# Patient Record
Sex: Female | Born: 1975 | Race: Black or African American | Hispanic: No | Marital: Single | Smoking: Former smoker
Health system: Southern US, Community
[De-identification: ages and names within clinical notes are randomized; demographics above are authoritative.]

## PROBLEM LIST (undated history)

## (undated) DIAGNOSIS — F329 Major depressive disorder, single episode, unspecified: Secondary | ICD-10-CM

## (undated) DIAGNOSIS — E890 Postprocedural hypothyroidism: Secondary | ICD-10-CM

## (undated) DIAGNOSIS — G4700 Insomnia, unspecified: Secondary | ICD-10-CM

## (undated) DIAGNOSIS — F32A Depression, unspecified: Secondary | ICD-10-CM

## (undated) DIAGNOSIS — F419 Anxiety disorder, unspecified: Secondary | ICD-10-CM

## (undated) DIAGNOSIS — L509 Urticaria, unspecified: Secondary | ICD-10-CM

## (undated) HISTORY — DX: Anxiety disorder, unspecified: F41.9

## (undated) HISTORY — DX: Depression, unspecified: F32.A

## (undated) HISTORY — DX: Major depressive disorder, single episode, unspecified: F32.9

## (undated) HISTORY — DX: Urticaria, unspecified: L50.9

## (undated) HISTORY — DX: Insomnia, unspecified: G47.00

## (undated) HISTORY — DX: Postprocedural hypothyroidism: E89.0

---

## 2000-12-25 ENCOUNTER — Emergency Department (HOSPITAL_COMMUNITY): Admission: EM | Admit: 2000-12-25 | Discharge: 2000-12-25 | Payer: Self-pay | Admitting: Emergency Medicine

## 2002-10-19 ENCOUNTER — Inpatient Hospital Stay (HOSPITAL_COMMUNITY): Admission: AD | Admit: 2002-10-19 | Discharge: 2002-10-19 | Payer: Self-pay | Admitting: Obstetrics and Gynecology

## 2002-11-01 ENCOUNTER — Ambulatory Visit (HOSPITAL_COMMUNITY): Admission: RE | Admit: 2002-11-01 | Discharge: 2002-11-01 | Payer: Self-pay | Admitting: *Deleted

## 2002-12-10 ENCOUNTER — Inpatient Hospital Stay (HOSPITAL_COMMUNITY): Admission: AD | Admit: 2002-12-10 | Discharge: 2002-12-10 | Payer: Self-pay | Admitting: Obstetrics

## 2003-01-24 ENCOUNTER — Inpatient Hospital Stay (HOSPITAL_COMMUNITY): Admission: AD | Admit: 2003-01-24 | Discharge: 2003-01-24 | Payer: Self-pay | Admitting: Obstetrics

## 2003-02-07 ENCOUNTER — Inpatient Hospital Stay (HOSPITAL_COMMUNITY): Admission: AD | Admit: 2003-02-07 | Discharge: 2003-02-07 | Payer: Self-pay | Admitting: Obstetrics

## 2003-04-06 ENCOUNTER — Encounter: Payer: Self-pay | Admitting: Obstetrics

## 2003-04-06 ENCOUNTER — Inpatient Hospital Stay (HOSPITAL_COMMUNITY): Admission: AD | Admit: 2003-04-06 | Discharge: 2003-04-08 | Payer: Self-pay | Admitting: *Deleted

## 2005-01-07 ENCOUNTER — Inpatient Hospital Stay (HOSPITAL_COMMUNITY): Admission: AD | Admit: 2005-01-07 | Discharge: 2005-01-07 | Payer: Self-pay | Admitting: Obstetrics

## 2005-02-06 ENCOUNTER — Inpatient Hospital Stay (HOSPITAL_COMMUNITY): Admission: AD | Admit: 2005-02-06 | Discharge: 2005-02-06 | Payer: Self-pay | Admitting: Obstetrics

## 2005-02-12 ENCOUNTER — Inpatient Hospital Stay (HOSPITAL_COMMUNITY): Admission: AD | Admit: 2005-02-12 | Discharge: 2005-02-12 | Payer: Self-pay | Admitting: Obstetrics

## 2005-04-15 ENCOUNTER — Inpatient Hospital Stay (HOSPITAL_COMMUNITY): Admission: AD | Admit: 2005-04-15 | Discharge: 2005-04-17 | Payer: Self-pay | Admitting: Obstetrics

## 2005-04-16 ENCOUNTER — Encounter (INDEPENDENT_AMBULATORY_CARE_PROVIDER_SITE_OTHER): Payer: Self-pay | Admitting: Specialist

## 2005-12-17 ENCOUNTER — Encounter (HOSPITAL_COMMUNITY): Admission: RE | Admit: 2005-12-17 | Discharge: 2006-03-17 | Payer: Self-pay | Admitting: Internal Medicine

## 2007-12-10 ENCOUNTER — Encounter: Payer: Self-pay | Admitting: Endocrinology

## 2008-01-18 ENCOUNTER — Emergency Department (HOSPITAL_COMMUNITY): Admission: EM | Admit: 2008-01-18 | Discharge: 2008-01-18 | Payer: Self-pay | Admitting: Emergency Medicine

## 2008-02-21 ENCOUNTER — Ambulatory Visit: Payer: Self-pay | Admitting: Endocrinology

## 2008-02-21 DIAGNOSIS — L509 Urticaria, unspecified: Secondary | ICD-10-CM

## 2008-02-21 HISTORY — DX: Urticaria, unspecified: L50.9

## 2008-03-22 ENCOUNTER — Encounter: Admission: RE | Admit: 2008-03-22 | Discharge: 2008-03-22 | Payer: Self-pay | Admitting: Endocrinology

## 2008-04-11 ENCOUNTER — Telehealth (INDEPENDENT_AMBULATORY_CARE_PROVIDER_SITE_OTHER): Payer: Self-pay | Admitting: *Deleted

## 2008-04-12 ENCOUNTER — Encounter: Payer: Self-pay | Admitting: Endocrinology

## 2008-04-20 ENCOUNTER — Encounter: Admission: RE | Admit: 2008-04-20 | Discharge: 2008-04-20 | Payer: Self-pay | Admitting: Endocrinology

## 2008-06-14 ENCOUNTER — Telehealth (INDEPENDENT_AMBULATORY_CARE_PROVIDER_SITE_OTHER): Payer: Self-pay | Admitting: *Deleted

## 2008-11-28 ENCOUNTER — Telehealth (INDEPENDENT_AMBULATORY_CARE_PROVIDER_SITE_OTHER): Payer: Self-pay | Admitting: *Deleted

## 2009-06-01 IMAGING — CR DG ANKLE COMPLETE 3+V*R*
3 series · 3 of 3 positions shown · non-contrast
Comparison: None

CLINICAL DATA: Pain post fall and twisted ankle

RIGHT ANKLE - COMPLETE 3+ VIEW

[t ankle joint ap right]
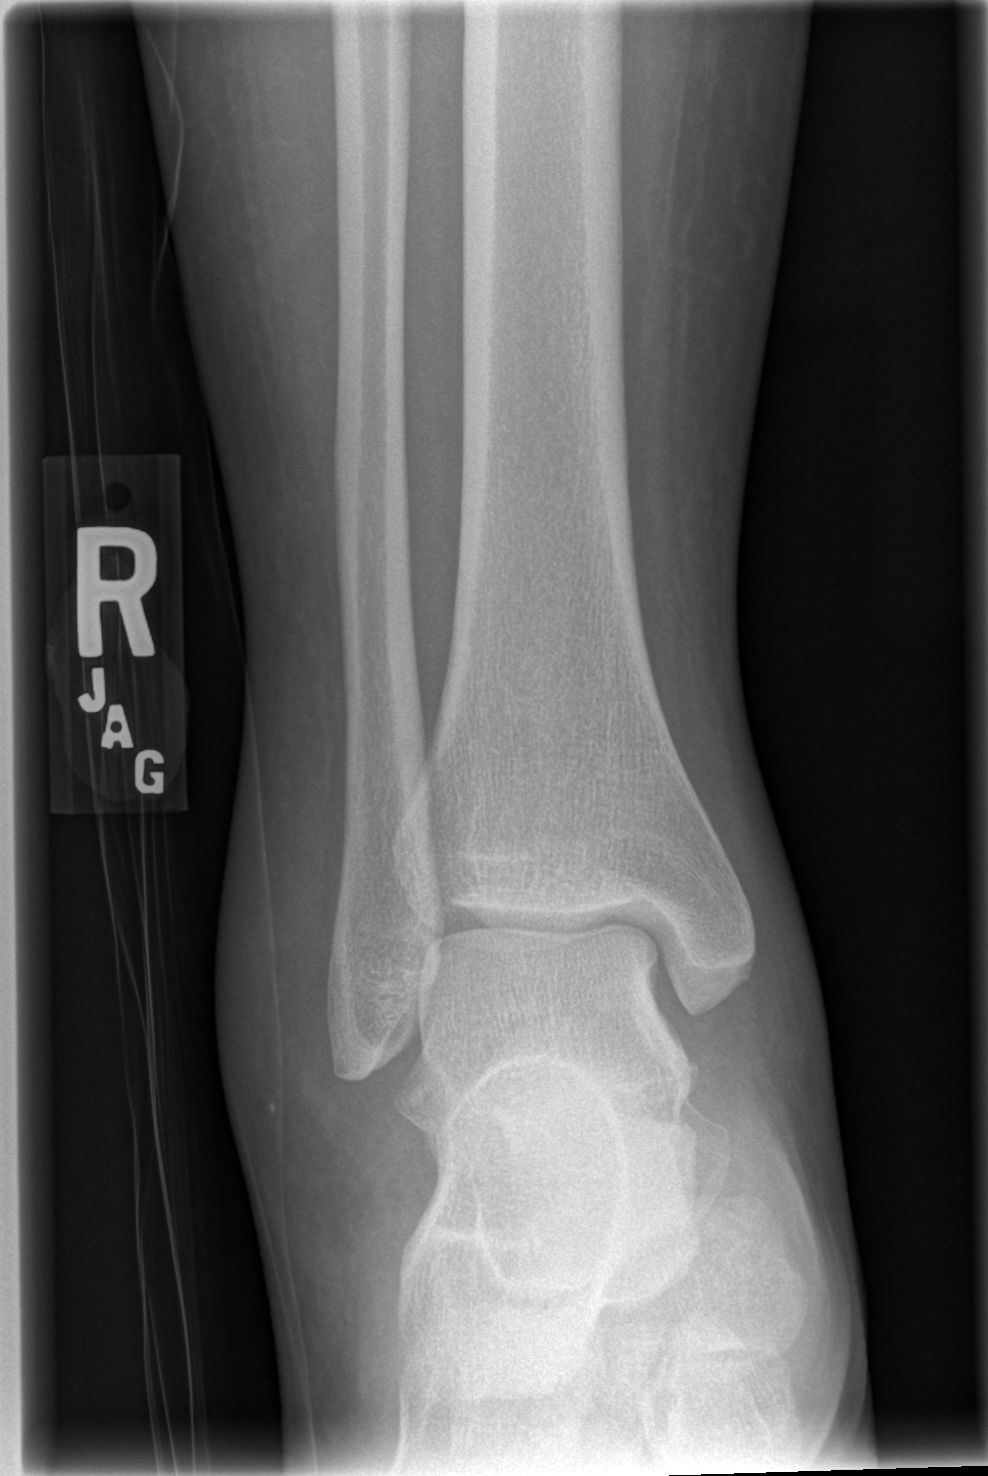

[t ankle joint oblique right]
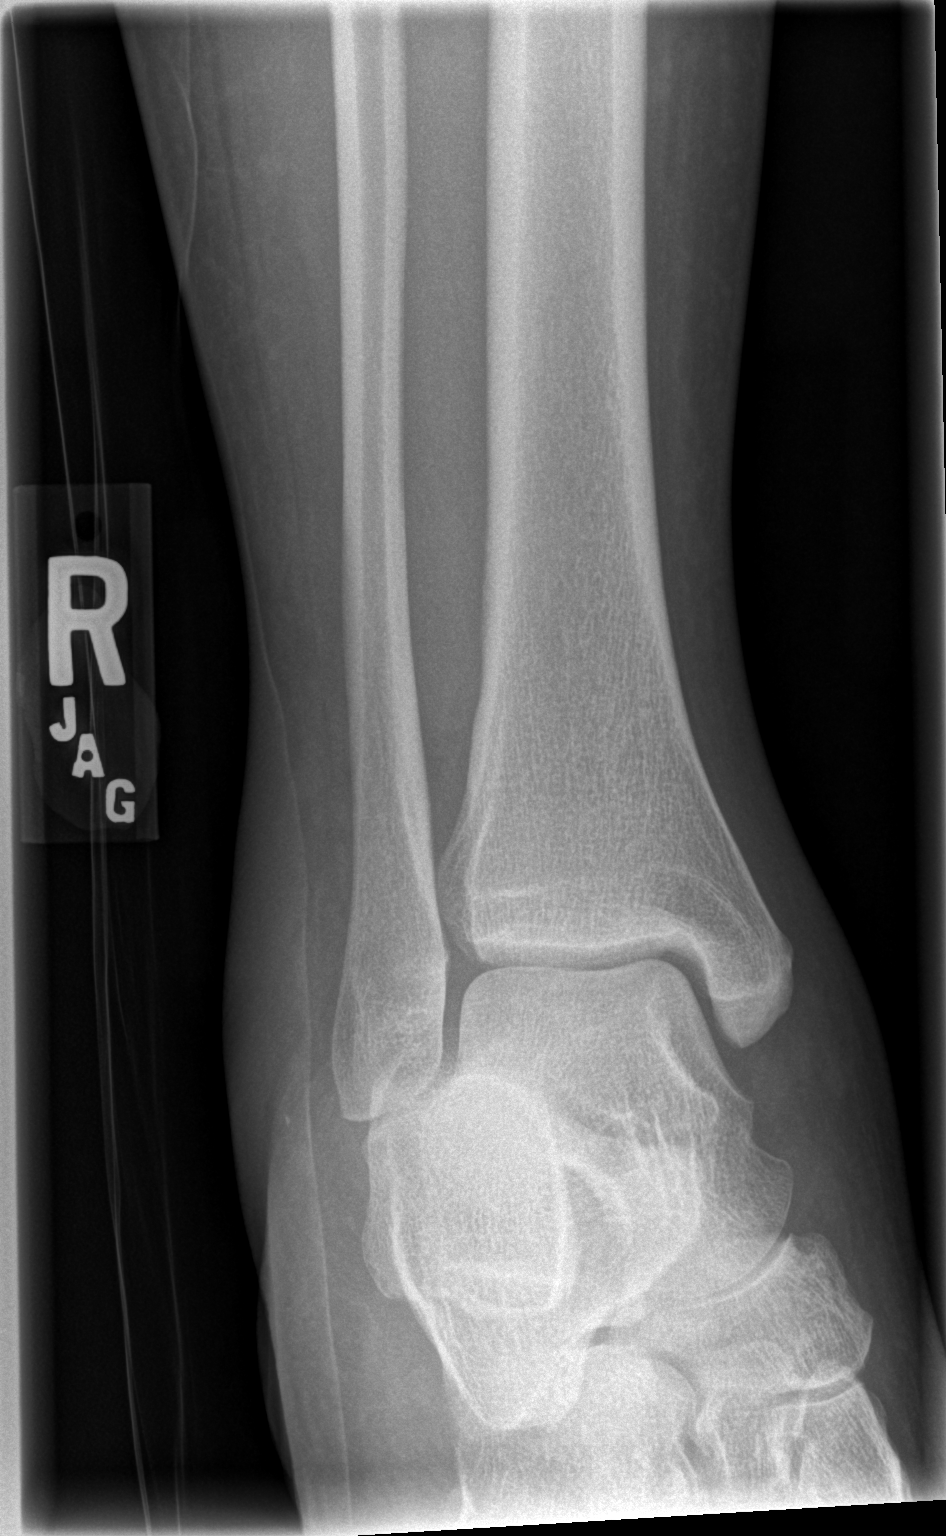

[t ankle joint lat right]
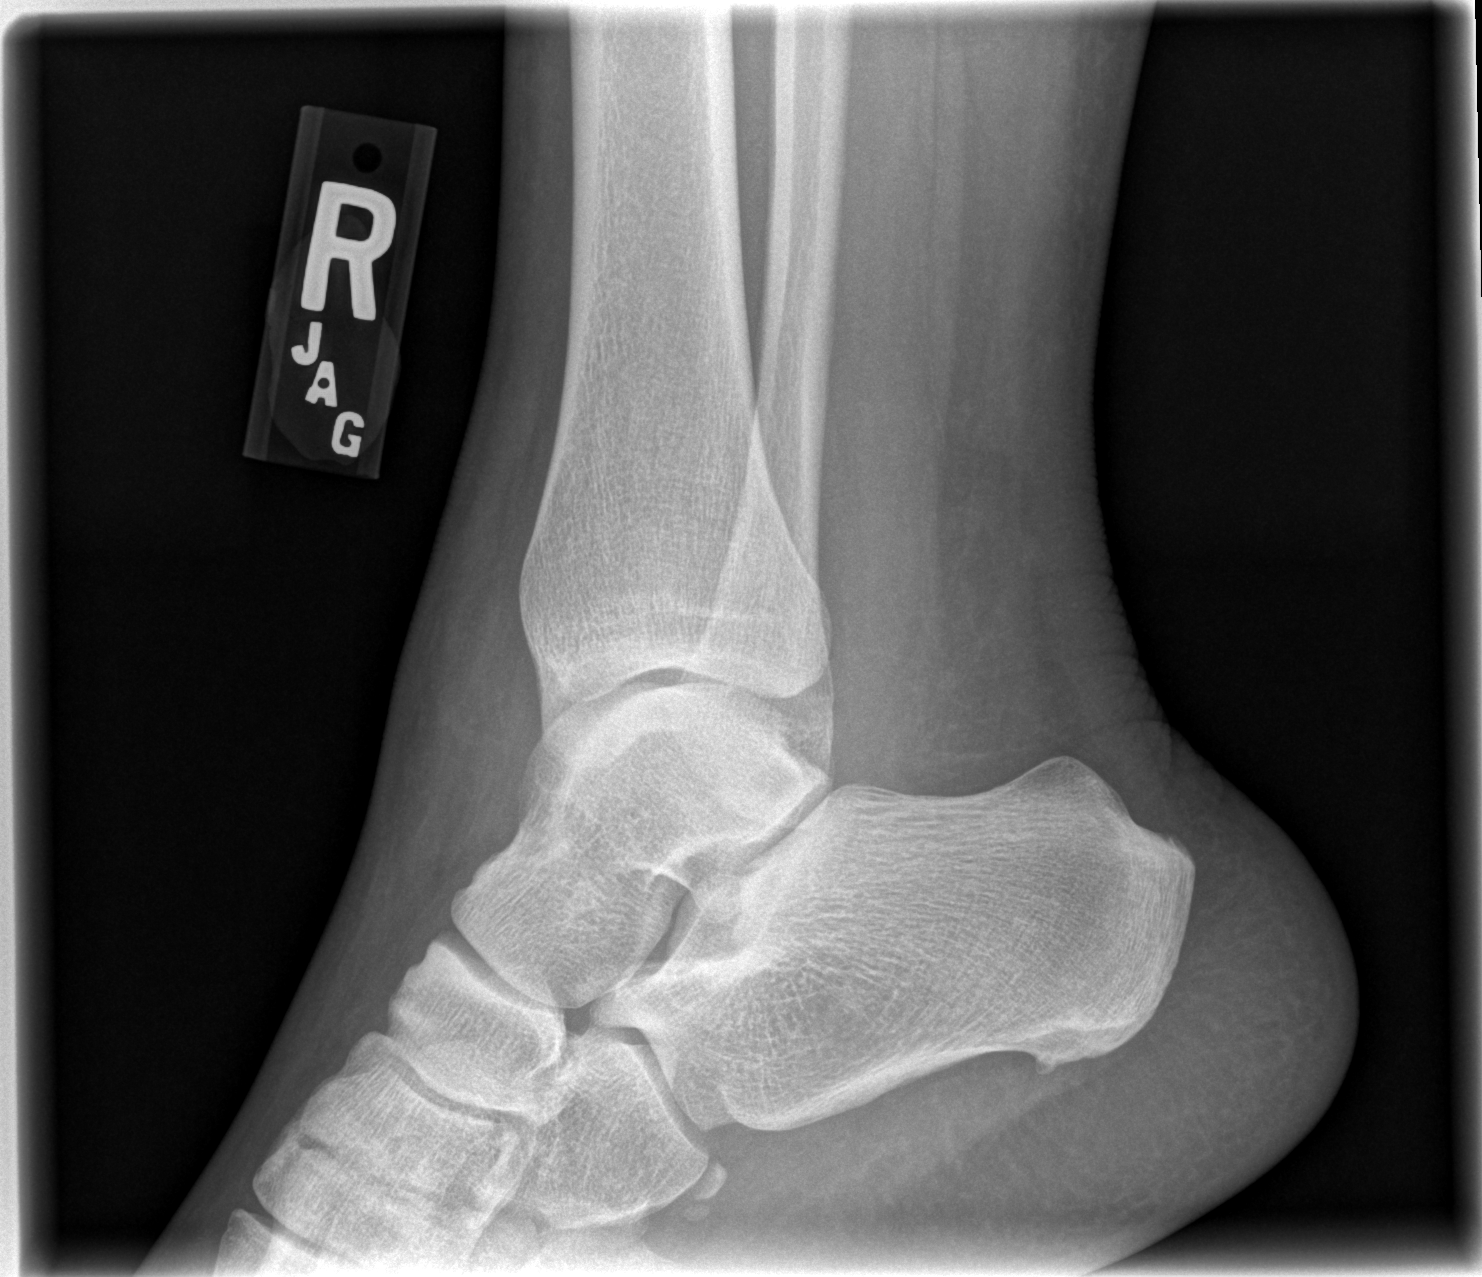

[3 of 3 positions shown; findings below may reference images not displayed]

FINDINGS: Three views of the right ankle shows no acute fracture or
dislocation.  Soft tissue swelling noted laterally adjacent to
lateral malleolus.  A small plantar spur of the calcaneus noted.
IMPRESSION: No acute fracture or subluxation.  Soft tissue swelling noted
laterally.  Small plantar spur of the calcaneus.

## 2009-12-11 ENCOUNTER — Ambulatory Visit: Payer: Self-pay | Admitting: Endocrinology

## 2009-12-11 DIAGNOSIS — E890 Postprocedural hypothyroidism: Secondary | ICD-10-CM

## 2009-12-11 HISTORY — DX: Postprocedural hypothyroidism: E89.0

## 2009-12-11 LAB — CONVERTED CEMR LAB: TSH: 22.44 microintl units/mL — ABNORMAL HIGH (ref 0.35–5.50)

## 2010-02-05 ENCOUNTER — Ambulatory Visit: Payer: Self-pay | Admitting: Endocrinology

## 2010-10-08 NOTE — Assessment & Plan Note (Signed)
Summary: f/u w/sae per pt/#/cd   Vital Signs:  Patient profile:   35 year old female Height:      65 inches (165.10 cm) Weight:      209.75 pounds (95.34 kg) BMI:     35.03 O2 Sat:      98 % on Room air Temp:     97.5 degrees F (36.39 degrees C) oral Pulse rate:   70 / minute BP sitting:   124 / 70  (left arm) Cuff size:   regular  Vitals Entered By: Josph Macho RMA (December 11, 2009 10:45 AM)  O2 Flow:  Room air CC: Follow-up visit/ pt states she is no longer taking Alprazolam, Prozac, or Hydroxyzine/ CF Is Patient Diabetic? No   Primary Provider:  Simone Curia  CC:  Follow-up visit/ pt states she is no longer taking Alprazolam, Prozac, and or Hydroxyzine/ CF.  History of Present Illness: pt had i-131 rx for hyperthyroidism in 2009.  she has fatigue and weight gain.  she is overdue for f/u.  Current Medications (verified): 1)  Alprazolam 1 Mg  Tabs (Alprazolam) .... Take 1/2-1 By Mouth Prn 2)  Prozac 20 Mg  Caps (Fluoxetine Hcl) .... Take 1 By Mouth Once Daily Prn 3)  Hydroxyzine Hcl 25 Mg  Tabs (Hydroxyzine Hcl) .... Take 1 At Bedtime Prn 4)  Xanax .... 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  Review of Systems       she has dry skin  Physical Exam  General:  normal appearance.   Head:  eyes: no periorbital swelling, no proptosis  Neck:  Supple without thyroid enlargement or tenderness.  Neurologic:  voice is slightly hoarse Additional Exam:  FastTSH              [H]  22.44 uIU/mL    Impression & Recommendations:  Problem # 1:  HYPOTHYROIDISM, POST-RADIATION (ICD-244.1) rx needed  Medications Added to Medication List This Visit: 1)  Levothyroxine Sodium 75 Mcg Tabs (Levothyroxine sodium) .Marland Kitchen.. 1 once  daily  Other Orders: TLB-TSH (Thyroid Stimulating Hormone) (84443-TSH) Est. Patient Level III ([address])  Patient Instructions: 1)  blood test today.  it will probably say that you need to start taking thyroid hormone, which you will need indefinitely.   2)  tests are being ordered for you today.  a few days after the test(s), please call [phone] to hear your test results. 3)  (update: i left message on phone-tree:  take synthroid 75 micrograms/day.  go to lab in 1 month for tsh 244.1). Prescriptions: LEVOTHYROXINE SODIUM 75 MCG TABS (LEVOTHYROXINE SODIUM) 1 once daily  #30 x 1   Entered and Authorized by:   Minus Breeding MD   Signed by:   Minus Breeding MD on 12/11/2009   Method used:   Electronically to        [facility] [phone]* (retail)       [address]       [place], [place]  [address]       Ph: [phone] or [phone]       Fax: [phone]   RxID:   [id number]

## 2011-01-24 NOTE — Op Note (Signed)
NAME:  Rachel Myers, Rachel Myers            ACCOUNT NO.:  [id number]   MEDICAL RECORD NO.:  [id number]          PATIENT TYPE:  INP   LOCATION:  9135                          FACILITY:  [facility]   PHYSICIAN:  Kathreen Cosier, M.D.DATE OF BIRTH:  1976/05/16   DATE OF PROCEDURE:  04/16/2005  DATE OF DISCHARGE:                                 OPERATIVE REPORT   PREOPERATIVE DIAGNOSIS:  Multiparity.   OPERATION/PROCEDURE:  Postpartum tubal ligation.   ANESTHESIA:  Epidural.   DESCRIPTION OF PROCEDURE:  The patient was placed in the supine position,  abdomen was prepped and draped. Bladder emptied with straight catheter.  Midline subumbilical incision one inch long was made, carried down to the  fascia.  Fascia was cleaned and grasped with two Kochers.  The fascia and  peritoneum opened.  The left tube was grasped in the mid portion with the  Babcock clamp. The tube was traced to the fimbria.  A 0 plain suture was  placed on the mesosalpinx below the portion of the tube within the clamp.  This was tied.  Approximately one inch of tube was transected.  Hemostasis  satisfactory.  Procedure was done in exact fashion on the other side.  Lap  and sponge counts correct.  Abdomen closed in layers.  Peritoneum and fascia  with continuous suture with 0 Dexon.  Skin closed with subcuticular stitch  of 4-0 Monocryl.  Blood loss minimal.  The patient tolerated the procedure  well and taken to the recovery room in good condition.       BAM/MEDQ  D:  04/16/2005  T:  04/16/2005  Job:  [id number]

## 2011-01-24 NOTE — Discharge Summary (Signed)
[name], [name]            ACCOUNT NO.:  [id number]   MEDICAL RECORD NO.:  [id number]          PATIENT TYPE:  INP   LOCATION:  9135                          [facility]ACILITY:  [facility]   PHYSICIAN:  Kathreen Cosier, M.D.DATE OF BIRTH:  1976-06-30   DATE OF ADMISSION:  04/15/2005  DATE OF DISCHARGE:  04/17/2005                                 DISCHARGE SUMMARY   The patient is a 35 year old gravida 3, para 1, 1, 1 with EDC April 15, 2005, who wanted to be induced so was brought in on April 15, 2005. Cervix 2  cm, 50%, vertex -3. She progressed satisfactorily and had a normal vaginal  delivery of a female infant with Apgar's 9 and 9. She desired sterilization  and on April 16, 2005 underwent postpartum tubal ligation.   On admission her hemoglobin was 11.7, white count 6800, platelets 189,000.  Post delivery hemoglobin was 10.9, white count 7400. RPR negative.  Urinalysis negative. Group B strep negative.   She was discharged home on the second postpartum day on Tylenol #3 for pain  with no complaints.   DISCHARGE DIAGNOSES:  1.  Status post normal vaginal delivery at term.  2.  Postpartum tubal ligation.       BAM/MEDQ  D:  04/17/2005  T:  04/17/2005  Job:  [id number]

## 2011-04-14 ENCOUNTER — Other Ambulatory Visit: Payer: Self-pay | Admitting: Endocrinology

## 2011-09-19 ENCOUNTER — Other Ambulatory Visit: Payer: Self-pay | Admitting: Endocrinology

## 2011-09-24 ENCOUNTER — Other Ambulatory Visit: Payer: Self-pay | Admitting: Endocrinology

## 2011-11-05 ENCOUNTER — Other Ambulatory Visit: Payer: Self-pay | Admitting: Endocrinology

## 2012-01-02 ENCOUNTER — Other Ambulatory Visit: Payer: Self-pay | Admitting: *Deleted

## 2012-01-02 MED ORDER — LEVOTHYROXINE SODIUM 75 MCG PO TABS: ORAL_TABLET | ORAL | Status: DC

## 2012-01-02 NOTE — Telephone Encounter (Signed)
Pt made appointment for F/U OV with MD in June. Pt is asking for refills of medication until her appointment. Rx sent to pharmacy, pt informed.

## 2012-02-09 ENCOUNTER — Ambulatory Visit: Payer: Self-pay | Admitting: Endocrinology

## 2012-02-13 ENCOUNTER — Ambulatory Visit: Payer: Self-pay | Admitting: Endocrinology

## 2012-04-02 ENCOUNTER — Ambulatory Visit: Payer: Self-pay | Admitting: Endocrinology

## 2012-04-16 ENCOUNTER — Encounter: Payer: Self-pay | Admitting: Endocrinology

## 2012-04-16 ENCOUNTER — Other Ambulatory Visit (INDEPENDENT_AMBULATORY_CARE_PROVIDER_SITE_OTHER): Payer: Managed Care, Other (non HMO)

## 2012-04-16 ENCOUNTER — Ambulatory Visit (INDEPENDENT_AMBULATORY_CARE_PROVIDER_SITE_OTHER): Payer: Managed Care, Other (non HMO) | Admitting: Endocrinology

## 2012-04-16 VITALS — BP 102/70 | HR 67 | Temp 97.9°F | Ht 65.0 in | Wt 193.0 lb

## 2012-04-16 DIAGNOSIS — E890 Postprocedural hypothyroidism: Secondary | ICD-10-CM

## 2012-04-16 LAB — TSH: TSH: 17.24 u[IU]/mL — ABNORMAL HIGH (ref 0.35–5.50)

## 2012-04-16 NOTE — Progress Notes (Signed)
  Subjective:    Patient ID: Rachel Myers, female    DOB: 19-Aug-1976, 36 y.o.   MRN: [id number]  HPI pt had i-131 rx for hyperthyroidism in 2009.  She has been on synthroid since then, but she is uncertain if she takes the synthroid as rx'ed.  pt states she feels well in general. Past Medical History  Diagnosis Date  . HYPOTHYROIDISM, POST-RADIATION 12/11/2009    Qualifier: Diagnosis of  By: Everardo All MD, Gregary Signs A   . UNSPECIFIED URTICARIA 02/21/2008    Qualifier: Diagnosis of  By: Everardo All MD, Irish Piech A     No past surgical history on file.  History   Social History  . Marital Status: Single    Spouse Name: N/A    Number of Children: N/A  . Years of Education: N/A   Occupational History  . Customer Service    Social History Main Topics  . Smoking status: Current Some Day Smoker  . Smokeless tobacco: Not on file  . Alcohol Use:   . Drug Use:   . Sexually Active:    Other Topics Concern  . 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    Current Outpatient Prescriptions on File Prior to Visit  Medication Sig Dispense Refill  . citalopram (CELEXA) 20 MG tablet Take 20 mg by mouth daily.      . clonazePAM (KLONOPIN) 0.5 MG tablet 1/2-1 tablet by mouth twice daily as needed      . levothyroxine (SYNTHROID, LEVOTHROID) 75 MCG tablet take 1 tablet by mouth once daily  30 tablet  2  . pantoprazole (PROTONIX) 40 MG tablet Take 40 mg by mouth daily.        No Known Allergies  Family History  Problem Relation Age of Onset  . Thyroid disease Neg Hx     No thyroid dz in immediate family    BP 102/70  Pulse 67  Temp 97.9 F (36.6 C) (Oral)  Ht 5\' 5"  (1.651 m)  Wt 193 lb (87.544 kg)  BMI 32.12 kg/m2  SpO2 98%  LMP 04/11/2012  Review of Systems She has lost a few lbs, due to her efforts.     Objective:   Physical Exam VITAL SIGNS:  See vs page GENERAL: no distress NECK: There is no palpable thyroid enlargement.  No  thyroid nodule is palpable.  No palpable lymphadenopathy at the anterior neck.     Lab Results  Component Value Date   TSH 17.24* 04/16/2012      Assessment & Plan:  Post-i-131 hypothyroidism.  Therapy possibly limited by noncompliance.  i'll do the best i can.

## 2012-04-16 NOTE — Patient Instructions (Addendum)
blood tests are being requested for you today.  You will receive a letter with results. Please return in 1 year. 

## 2012-04-19 ENCOUNTER — Telehealth: Payer: Self-pay | Admitting: *Deleted

## 2012-04-19 DIAGNOSIS — E890 Postprocedural hypothyroidism: Secondary | ICD-10-CM

## 2012-04-19 NOTE — Telephone Encounter (Signed)
Per Result note, pt states that prior to [facility] lab she had missed taking her Levothryoxine for 1 week because she was unable to pay for it at that particular time.

## 2012-04-19 NOTE — Telephone Encounter (Signed)
please call patient: Please continue same rx Recheck blood tet in 1 month.  i have ordered

## 2012-04-20 NOTE — Telephone Encounter (Signed)
Pt informed of MD's advisement. 

## 2012-04-22 ENCOUNTER — Other Ambulatory Visit: Payer: Self-pay | Admitting: Family Medicine

## 2012-04-22 DIAGNOSIS — R109 Unspecified abdominal pain: Secondary | ICD-10-CM

## 2012-04-30 ENCOUNTER — Other Ambulatory Visit (HOSPITAL_COMMUNITY): Payer: Managed Care, Other (non HMO)

## 2012-05-28 ENCOUNTER — Other Ambulatory Visit (HOSPITAL_COMMUNITY): Payer: Managed Care, Other (non HMO)

## 2012-10-10 ENCOUNTER — Other Ambulatory Visit: Payer: Self-pay | Admitting: Endocrinology

## 2012-10-11 NOTE — Telephone Encounter (Signed)
PHARMACY REQUEST REFILL ON MEDICATION LEVOTHYROXINE. LAST OV WAS 04/16/2012.

## 2012-11-29 ENCOUNTER — Encounter: Payer: Self-pay | Admitting: *Deleted

## 2012-12-28 ENCOUNTER — Other Ambulatory Visit: Payer: Self-pay | Admitting: Endocrinology

## 2013-01-04 ENCOUNTER — Other Ambulatory Visit: Payer: Self-pay | Admitting: Nurse Practitioner

## 2013-01-05 NOTE — Telephone Encounter (Signed)
Rx printed and called into [facility].

## 2013-01-05 NOTE — Telephone Encounter (Signed)
Needs refill on Klonopin 0.5 mg, PE 03/05/12

## 2013-01-05 NOTE — Telephone Encounter (Signed)
Ok times one with one ref 

## 2013-01-07 ENCOUNTER — Encounter: Payer: Self-pay | Admitting: *Deleted

## 2013-05-30 ENCOUNTER — Encounter: Payer: Self-pay | Admitting: Family Medicine

## 2013-06-06 ENCOUNTER — Ambulatory Visit (INDEPENDENT_AMBULATORY_CARE_PROVIDER_SITE_OTHER): Payer: Managed Care, Other (non HMO) | Admitting: Family Medicine

## 2013-06-06 ENCOUNTER — Encounter: Payer: Self-pay | Admitting: Family Medicine

## 2013-06-06 VITALS — BP 114/70 | HR 70 | Ht 65.75 in | Wt 194.0 lb

## 2013-06-06 DIAGNOSIS — Z1159 Encounter for screening for other viral diseases: Secondary | ICD-10-CM

## 2013-06-06 DIAGNOSIS — Z0000 Encounter for general adult medical examination without abnormal findings: Secondary | ICD-10-CM

## 2013-06-06 DIAGNOSIS — Z124 Encounter for screening for malignant neoplasm of cervix: Secondary | ICD-10-CM

## 2013-06-06 MED ORDER — HYDROCORTISONE 2.5 % RE CREA: TOPICAL_CREAM | Freq: Three times a day (TID) | RECTAL | Status: DC

## 2013-06-06 MED ORDER — PANTOPRAZOLE SODIUM 40 MG PO TBEC: 40.0000 mg | DELAYED_RELEASE_TABLET | Freq: Every day | ORAL | Status: DC

## 2013-06-06 NOTE — Progress Notes (Signed)
  Subjective:    Patient ID: Genene Churn, female    DOB: 02-25-1976, 37 y.o.   MRN: [id number]  HPIHere for a physical.   Hemorroid. sstarted acting up in the past yr, bleeding, unconfortable, not much constipation,  No medication for the hemorrhoids, blood mixed in with stools  Use btl,  Sp eight yrs ago  Exercising some off and on,so so on the cardio  Diet very poor, eats once perday, One so per day Reflux. Takes in a lot of cafffeince Chol panel overall good   Difficulty focusing. Never on meds.   Not sleeping.   Works as Therapist, nutritional   Review of Systems No chest pain no back pain no abdominal pain no change in urinary or bowel habits somewhat diminished energy he claims no depression notes some anxiety at times however.    Objective:   Physical Exam Alert HEENT normal. Lungs clear. Heart regular in rhythm. Breasts without masses. Abdomen benign. Perirectal region couple small hemorrhoids noted pelvic rectal exam within normal limits. Pap smear obtained.       Assessment & Plan:  Impression #1 wellness exam #2 hemorrhoids discussed #3 significant reflux. Plan diet exercise discussed. Medications prescribed. Followup as scheduled for further assessment of symptomatology. WSL

## 2013-06-07 LAB — PAP IG W/ RFLX HPV ASCU

## 2013-08-08 ENCOUNTER — Ambulatory Visit: Payer: Managed Care, Other (non HMO) | Admitting: Family Medicine

## 2013-09-12 ENCOUNTER — Ambulatory Visit: Payer: Managed Care, Other (non HMO) | Admitting: Family Medicine

## 2013-09-21 ENCOUNTER — Ambulatory Visit: Payer: Managed Care, Other (non HMO) | Admitting: Family Medicine

## 2013-10-31 ENCOUNTER — Other Ambulatory Visit: Payer: Self-pay | Admitting: Endocrinology

## 2014-01-05 ENCOUNTER — Other Ambulatory Visit: Payer: Self-pay | Admitting: Endocrinology

## 2014-01-19 ENCOUNTER — Other Ambulatory Visit: Payer: Self-pay | Admitting: Endocrinology

## 2014-02-01 ENCOUNTER — Telehealth: Payer: Self-pay | Admitting: *Deleted

## 2014-02-01 NOTE — Telephone Encounter (Signed)
Please refill x 1.  This is last refill until appt

## 2014-02-01 NOTE — Telephone Encounter (Signed)
Request RX refill x2 still waiting to get it, been out of medication for 2 wks

## 2014-02-01 NOTE — Telephone Encounter (Signed)
Pt is requesting a refill on her thyroid medication. Pt was instructed back in February to make an appointment and did not. Pt does not have insurance and is waiting for her medicaid to start. Pt was last seen on 04/2012. Please advise, Thanks!

## 2014-02-02 MED ORDER — LEVOTHYROXINE SODIUM 75 MCG PO TABS: ORAL_TABLET | ORAL | Status: DC

## 2014-02-02 NOTE — Telephone Encounter (Signed)
Pt advised.

## 2014-03-15 ENCOUNTER — Ambulatory Visit: Payer: Managed Care, Other (non HMO) | Admitting: Endocrinology

## 2014-03-23 ENCOUNTER — Encounter: Payer: Self-pay | Admitting: Endocrinology

## 2014-03-23 ENCOUNTER — Ambulatory Visit (INDEPENDENT_AMBULATORY_CARE_PROVIDER_SITE_OTHER): Payer: Medicaid Other | Admitting: Endocrinology

## 2014-03-23 VITALS — BP 118/64 | HR 80 | Temp 98.5°F | Wt 193.0 lb

## 2014-03-23 DIAGNOSIS — E890 Postprocedural hypothyroidism: Secondary | ICD-10-CM

## 2014-03-23 LAB — T4, FREE: FREE T4: 0.54 ng/dL — AB (ref 0.60–1.60)

## 2014-03-23 LAB — TSH: TSH: 6.16 u[IU]/mL — AB (ref 0.35–4.50)

## 2014-03-23 MED ORDER — LEVOTHYROXINE SODIUM 75 MCG PO TABS: ORAL_TABLET | ORAL | Status: DC

## 2014-03-23 NOTE — Progress Notes (Signed)
   Subjective:    Patient ID: Rachel Myers, female    DOB: 12/12/1975, 38 y.o.   MRN: [id number]  HPI pt returns for f/u of post I-131 hypothyroidism (she had I-131 rx for hyperthyroidism, due to Grave's dz in 2009; she has been on synthroid since then; she has had TL).  Pt says she misses the synthroid approx twice a week.  She has fatigue.   Past Medical History  Diagnosis Date  . HYPOTHYROIDISM, POST-RADIATION 12/11/2009    Qualifier: Diagnosis of  By: Everardo AllEllison MD, Gregary SignsSean A   . UNSPECIFIED URTICARIA 02/21/2008    Qualifier: Diagnosis of  By: Everardo AllEllison MD, Cleophas DunkerSean A   . Depression   . Anxiety   . Insomnia     No past surgical history on file.  History   Social History  . Marital Status: Single    Spouse Name: N/A    Number of Children: N/A  . Years of Education: N/A   Occupational History  . Customer Service    Social History Main Topics  . Smoking status: Former Games developermoker  . Smokeless tobacco: Not on file  . Alcohol Use: Not on file  . Drug Use: Not on file  . Sexual Activity: Not on file   Other Topics Concern  . Not on file   Social History Narrative   Single mother.   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          Current Outpatient Prescriptions on File Prior to Visit  Medication Sig Dispense Refill  . clonazePAM (KLONOPIN) 0.5 MG tablet TAKE 1/2-1 TABLET BY MOUTH TWICE DAILY AS NEEDED FOR ANXIETY  30 tablet  0  . hydrocortisone (ANUSOL-HC) 2.5 % rectal cream Place rectally 3 (three) times daily.  30 g  3  . pantoprazole (PROTONIX) 40 MG tablet Take 1 tablet (40 mg total) by mouth daily.  30 tablet  3   No current facility-administered medications on file prior to visit.    No Known Allergies  Family History  Problem Relation Age of Onset  . Thyroid disease Neg Hx     No thyroid dz in immediate family    BP 118/64  Pulse 80  Temp(Src) 98.5 F (36.9 C) (Oral)  Wt 193 lb (87.544 kg)  SpO2 96%  Review of Systems Denies weight change.      Objective:     Physical Exam VITAL SIGNS:  See vs page GENERAL: no distress NECK: There is no palpable thyroid enlargement.  No thyroid nodule is palpable.  No palpable lymphadenopathy at the anterior neck.    Lab Results  Component Value Date   TSH 6.16* 03/23/2014      Assessment & Plan:  Hypothyroidism: mild exacerbation Noncompliance with medication: I'll work around this as best I can.  Patient is advised the following: Patient Instructions  blood tests are being requested for you today.  We'll contact you with results. It is really important to take the pill each day.  If you do miss a pill, you can take 2 the next day. Please return in 1 year.

## 2014-03-23 NOTE — Patient Instructions (Addendum)
blood tests are being requested for you today.  We'll contact you with results. It is really important to take the pill each day.  If you do miss a pill, you can take 2 the next day. Please return in 1 year.

## 2014-04-01 ENCOUNTER — Other Ambulatory Visit: Payer: Self-pay | Admitting: Nurse Practitioner

## 2014-04-03 NOTE — Telephone Encounter (Signed)
Ok times one 

## 2014-04-13 ENCOUNTER — Encounter: Payer: Self-pay | Admitting: Family Medicine

## 2014-04-13 ENCOUNTER — Ambulatory Visit (INDEPENDENT_AMBULATORY_CARE_PROVIDER_SITE_OTHER): Payer: Medicaid Other | Admitting: Family Medicine

## 2014-04-13 VITALS — BP 122/80 | Ht 65.0 in | Wt 193.0 lb

## 2014-04-13 DIAGNOSIS — F900 Attention-deficit hyperactivity disorder, predominantly inattentive type: Secondary | ICD-10-CM

## 2014-04-13 DIAGNOSIS — F909 Attention-deficit hyperactivity disorder, unspecified type: Secondary | ICD-10-CM

## 2014-04-13 MED ORDER — METHYLPHENIDATE HCL ER (OSM) 36 MG PO TBCR: 36.0000 mg | EXTENDED_RELEASE_TABLET | Freq: Every day | ORAL | Status: DC

## 2014-04-13 MED ORDER — METHYLPHENIDATE HCL ER (OSM) 27 MG PO TBCR: 27.0000 mg | EXTENDED_RELEASE_TABLET | ORAL | Status: DC

## 2014-04-13 NOTE — Progress Notes (Signed)
   Subjective:    Patient ID: Rachel Myers, female    DOB: 05/06/1976, 38 y.o.   MRN: [id number]  HPI Starting school August 18th. Would like to discuss starting ADD meds. Trouble focusing.   Attn and focusing issue,   botrh kids have it  Difficult to stay focused on things  Difficulty with reading and focusing   Multiple DSM criteria questions asked. Over 14 questions asked. Strongly positive for inattentive-type ADHD.  Of note both children are on medication for ADHD. It was here the patient first arrived she has significant problem with that.  To start college soon and wants to be at her full capacity. Review of Systems No headache no chest pain no back pain no abdominal pain no change in bowel habits no blood in stool    Objective:   Physical Exam  Alert no apparent distress. Vital stable. HEENT normal. Lungs clear. Heart regular in rhythm. Neuro exam intact.      Assessment & Plan:  Impression ADHD primarily inattentive type discussed at length plan initiate proper medication. Rationale discussed. Diet and exercise encourage. Many questions answered. Followup as scheduled WSL

## 2014-04-16 DIAGNOSIS — F909 Attention-deficit hyperactivity disorder, unspecified type: Secondary | ICD-10-CM | POA: Insufficient documentation

## 2014-05-10 ENCOUNTER — Telehealth: Payer: Self-pay | Admitting: Family Medicine

## 2014-05-10 MED ORDER — METHYLPHENIDATE HCL ER (OSM) 36 MG PO TBCR: 36.0000 mg | EXTENDED_RELEASE_TABLET | Freq: Every day | ORAL | Status: DC

## 2014-05-10 NOTE — Telephone Encounter (Signed)
Rx up front for patient pick up. Patient notified. 
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
## 2014-05-10 NOTE — Telephone Encounter (Signed)
According to the last time she had her prescription filled she may go ahead and have a prescription for 30. This may be filled for and 05/14/2014. Recommend keeping regular followups (advised patient to keep windows shut when driving down the highway with prescriptions)

## 2014-06-13 ENCOUNTER — Ambulatory Visit: Payer: Medicaid Other | Admitting: Family Medicine

## 2014-07-07 ENCOUNTER — Telehealth: Payer: Self-pay | Admitting: Family Medicine

## 2014-07-07 NOTE — Telephone Encounter (Signed)
Patient needs refill om methylphenidate 36mg . Mom will pick when ready.

## 2014-07-07 NOTE — Telephone Encounter (Signed)
May fill 30 day, set up OV

## 2014-07-07 NOTE — Telephone Encounter (Signed)
Last add check up was 04/13/2014

## 2014-07-10 MED ORDER — METHYLPHENIDATE HCL ER (OSM) 36 MG PO TBCR: 36.0000 mg | EXTENDED_RELEASE_TABLET | Freq: Every day | ORAL | Status: DC

## 2014-07-10 NOTE — Telephone Encounter (Signed)
Notified patient script will be ready for pickup today. Set up OV. Patient verbalized understanding.

## 2014-07-10 NOTE — Addendum Note (Signed)
Addended by: Dereck LigasJOHNSON, Maude Hettich P on: 07/10/2014 08:25 AM   Modules accepted: Orders

## 2014-07-18 ENCOUNTER — Telehealth: Payer: Self-pay | Admitting: Family Medicine

## 2014-07-18 NOTE — Telephone Encounter (Signed)
Patient is requesting a nurse to call her back regarding a medical question that she refused to tell me what it was about.

## 2014-07-18 NOTE — Telephone Encounter (Signed)
Spoke with patient. She believes she has a UTI and she was hoping to not have to come in and that antibiotics would be called in. I told her unfortunately, we are unable to prescribe antibiotics without being seen. We would have to see her in order to diagnose her b/c it may not be a UTI. Pt verbalized understanding and said that she would "think of something else" and hung up.

## 2014-09-07 ENCOUNTER — Other Ambulatory Visit: Payer: Self-pay | Admitting: Family Medicine

## 2014-09-07 NOTE — Telephone Encounter (Signed)
Last seen 8/6

## 2014-09-09 NOTE — Telephone Encounter (Signed)
Ok times one 

## 2014-10-06 ENCOUNTER — Other Ambulatory Visit: Payer: Self-pay | Admitting: Family Medicine

## 2014-10-06 MED ORDER — METHYLPHENIDATE HCL ER (OSM) 36 MG PO TBCR: 36.0000 mg | EXTENDED_RELEASE_TABLET | Freq: Every day | ORAL | Status: DC

## 2014-10-06 NOTE — Telephone Encounter (Signed)
Rx up front for patient pick up. Patient notified. 

## 2014-10-06 NOTE — Telephone Encounter (Signed)
Ok times one 

## 2014-10-06 NOTE — Addendum Note (Signed)
Addended by: Margaretha SheffieldBROWN, Lyden Redner S on: 10/06/2014 02:43 PM   Modules accepted: Orders

## 2014-10-06 NOTE — Telephone Encounter (Signed)
Patient needs refill on ADHD meds methylphenidate 36 mg  Has appointment for follow up on 2/25 for med check just needs enough until seen.

## 2014-10-06 NOTE — Telephone Encounter (Signed)
Last seen 8/15 

## 2014-11-01 ENCOUNTER — Ambulatory Visit (INDEPENDENT_AMBULATORY_CARE_PROVIDER_SITE_OTHER): Payer: Medicaid Other | Admitting: Family Medicine

## 2014-11-01 ENCOUNTER — Encounter: Payer: Self-pay | Admitting: Family Medicine

## 2014-11-01 VITALS — BP 118/82 | Ht 65.0 in | Wt 194.0 lb

## 2014-11-01 DIAGNOSIS — F900 Attention-deficit hyperactivity disorder, predominantly inattentive type: Secondary | ICD-10-CM

## 2014-11-01 DIAGNOSIS — N898 Other specified noninflammatory disorders of vagina: Secondary | ICD-10-CM | POA: Diagnosis not present

## 2014-11-01 DIAGNOSIS — R300 Dysuria: Secondary | ICD-10-CM | POA: Diagnosis not present

## 2014-11-01 LAB — POCT URINALYSIS DIPSTICK
NITRITE UA: POSITIVE
SPEC GRAV UA: 1.015
pH, UA: 5

## 2014-11-01 MED ORDER — METHYLPHENIDATE HCL ER (OSM) 54 MG PO TBCR: 54.0000 mg | EXTENDED_RELEASE_TABLET | Freq: Every day | ORAL | Status: DC

## 2014-11-01 MED ORDER — AMOXICILLIN 500 MG PO CAPS: 500.0000 mg | ORAL_CAPSULE | Freq: Three times a day (TID) | ORAL | Status: AC

## 2014-11-01 NOTE — Progress Notes (Signed)
   Subjective:    Patient ID: Rachel Myers, female    DOB: 04/10/1976, 39 y.o.   MRN: [id number]  HPI Patient is here today for an ADHD check up.  She would like to be on a med that is extended release.  Also wanted her urine checked for UTI. She had dysuria couple weeks ago, and now has discharge. Increased urinary frequency. meds improved, does not take during break but does on the weekend  Patient states overall ADHD medication helping her. Once "extended release". Patient advised that she already is on this.   Review of Systems No headache no chest pain no back pain no fever no chills    Objective:   Physical Exam  Alert no acute distress lungs clear heart rare rhythm H&T normal no CVA tenderness next  Urinalysis 6 white blood cells per high-power field positive rods noted in microscope      Assessment & Plan:  Impression 1 ADHD good control #2 UTI plan antibiotics prescribed. Medications refilled. Diet exercise discussed. Follow-up as scheduled. WSL

## 2014-11-02 ENCOUNTER — Ambulatory Visit: Payer: Medicaid Other | Admitting: Family Medicine

## 2014-12-28 ENCOUNTER — Encounter: Payer: Self-pay | Admitting: Family Medicine

## 2014-12-28 ENCOUNTER — Ambulatory Visit (INDEPENDENT_AMBULATORY_CARE_PROVIDER_SITE_OTHER): Payer: Medicaid Other | Admitting: Family Medicine

## 2014-12-28 VITALS — BP 110/68 | Ht 65.75 in | Wt 197.0 lb

## 2014-12-28 DIAGNOSIS — Z79899 Other long term (current) drug therapy: Secondary | ICD-10-CM

## 2014-12-28 DIAGNOSIS — Z0000 Encounter for general adult medical examination without abnormal findings: Secondary | ICD-10-CM

## 2014-12-28 DIAGNOSIS — R5383 Other fatigue: Secondary | ICD-10-CM | POA: Diagnosis not present

## 2014-12-28 DIAGNOSIS — D649 Anemia, unspecified: Secondary | ICD-10-CM | POA: Diagnosis not present

## 2014-12-28 LAB — POCT HEMOGLOBIN: Hemoglobin: 9.1 g/dL — AB (ref 12.2–16.2)

## 2014-12-28 NOTE — Patient Instructions (Signed)
Iron gluconate tablets one twice per day with food  Follow up in a couple of months for your adhd visit and we will re ck your hgb that day (and pap smear)
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
## 2015-02-27 ENCOUNTER — Encounter: Payer: Medicaid Other | Admitting: Family Medicine

## 2015-03-08 ENCOUNTER — Encounter: Payer: Self-pay | Admitting: Family Medicine

## 2015-03-08 ENCOUNTER — Ambulatory Visit (INDEPENDENT_AMBULATORY_CARE_PROVIDER_SITE_OTHER): Payer: Medicaid Other | Admitting: Family Medicine

## 2015-03-08 VITALS — BP 124/82 | Ht 65.75 in | Wt 192.2 lb

## 2015-03-08 DIAGNOSIS — Z113 Encounter for screening for infections with a predominantly sexual mode of transmission: Secondary | ICD-10-CM

## 2015-03-08 DIAGNOSIS — D649 Anemia, unspecified: Secondary | ICD-10-CM | POA: Diagnosis not present

## 2015-03-08 DIAGNOSIS — F900 Attention-deficit hyperactivity disorder, predominantly inattentive type: Secondary | ICD-10-CM | POA: Diagnosis not present

## 2015-03-08 DIAGNOSIS — G4700 Insomnia, unspecified: Secondary | ICD-10-CM | POA: Diagnosis not present

## 2015-03-08 DIAGNOSIS — Z124 Encounter for screening for malignant neoplasm of cervix: Secondary | ICD-10-CM

## 2015-03-08 DIAGNOSIS — Z1151 Encounter for screening for human papillomavirus (HPV): Secondary | ICD-10-CM

## 2015-03-08 LAB — POCT HEMOGLOBIN: Hemoglobin: 9.8 g/dL — AB (ref 12.2–16.2)

## 2015-03-08 MED ORDER — METHYLPHENIDATE HCL ER (OSM) 54 MG PO TBCR: 54.0000 mg | EXTENDED_RELEASE_TABLET | Freq: Every day | ORAL | Status: DC

## 2015-03-08 NOTE — Patient Instructions (Signed)
Iron gluconate one tab twice per day for thirty days, then one daily

## 2015-03-08 NOTE — Progress Notes (Signed)
   Subjective:    Patient ID: Rachel Myers, female    DOB: 02/06/1976, 39 y.o.   MRN: [id number] Patient arrives office with several concerns. HPI  Patient was seen today for ADD checkup. -weight, vital signs reviewed.  The following items were covered. -Compliance with medication : Good  - Eating patterns : Good  -sleeping: Poor sleep schedule, Patient states difficulty sleeping.  -Additional issues or questions: Patient states no other concerns this visit.  Results for orders placed or performed in visit on 03/08/15  POCT hemoglobin  Result Value Ref Range   Hemoglobin 9.8 (A) 12.2 - 16.2 g/dL   Please see prior note. Treated for anemia. Started on proper iron medication. Patient took it only 2 weeks and then stopped.  History of abnormal Pap smear. Unable to perform last wellness exam. Patient reminded of this and we will set her up. Review of Systems No headache no chest pain no cough no shortness of breath no abdominal pain    Objective:   Physical Exam Alert vitals stable. HEENT normal. Lungs clear. Heart regular in rhythm. Pelvic exam performed Pap smear obtained.       Assessment & Plan:  Impression 1 ADHD control improves #2 anemia likely iron deficiency with noncompliance. #3 history abnormal Pap smear Obtained today plan iron gluconate twice a day for one month then daily long-term. ADHD medications reviewed. Diet exercise discussed. Follow-up in 4 months as scheduled WSL

## 2015-03-14 LAB — PAP IG, CT-NG NAA, HPV HIGH-RISK
CHLAMYDIA, NUC. ACID AMP: NEGATIVE
Gonococcus by Nucleic Acid Amp: NEGATIVE
HPV, high-risk: NEGATIVE
PAP SMEAR COMMENT: 0

## 2015-03-26 ENCOUNTER — Ambulatory Visit: Payer: Medicaid Other | Admitting: Endocrinology

## 2015-03-29 ENCOUNTER — Other Ambulatory Visit: Payer: Self-pay | Admitting: Endocrinology

## 2015-03-30 NOTE — Telephone Encounter (Signed)
Please advise if ok to refill. Last OV 03/23/14. Thanks!

## 2015-04-27 ENCOUNTER — Ambulatory Visit (INDEPENDENT_AMBULATORY_CARE_PROVIDER_SITE_OTHER): Payer: Medicaid Other | Admitting: Family Medicine

## 2015-04-27 ENCOUNTER — Encounter: Payer: Self-pay | Admitting: Family Medicine

## 2015-04-27 VITALS — BP 124/82 | Ht 65.75 in | Wt 193.1 lb

## 2015-04-27 DIAGNOSIS — N921 Excessive and frequent menstruation with irregular cycle: Secondary | ICD-10-CM

## 2015-04-27 DIAGNOSIS — D649 Anemia, unspecified: Secondary | ICD-10-CM | POA: Diagnosis not present

## 2015-04-27 MED ORDER — NORETHIN-ETH ESTRAD TRIPHASIC 0.5/0.75/1-35 MG-MCG PO TABS: 1.0000 | ORAL_TABLET | Freq: Every day | ORAL | Status: AC

## 2015-04-27 NOTE — Progress Notes (Signed)
   Subjective:    Patient ID: Rachel Myers, female    DOB: [birth year], 39 y.o.   MRN: [id number]  HPI  Patient in today to discuss contraceptive use to help regulate irregular periods.Patient states that she stays on her periods for about two weeks, now coming on twice per month. Not good, partic with iron, feels tired nd fatigued.  Heavy initially first couple days  Does not smoke, helped in the past as far as bcp's    . Onset of symptoms July 2016. Patient would also like to discuss iron levels.   Patient states last iron she was prescribed upset her stomach and she is unable to take it.    Review of Systems No headache no chest pain no back pain    Objective:   Physical Exam  Alert vitals stable no acute distress H&T normal lungs clear. Heart regular in rhythm.      Assessment & Plan:  Impression 1 menorrhagia #2 anemia likely iron deficiency plan initiate Ortho-Novum 7/7/7. Rationale discussed. Take multivitamin with at least one iron gluconate per day. WSL

## 2015-05-09 ENCOUNTER — Other Ambulatory Visit: Payer: Self-pay | Admitting: Endocrinology

## 2015-05-10 ENCOUNTER — Other Ambulatory Visit: Payer: Self-pay | Admitting: Family Medicine

## 2015-05-11 NOTE — Telephone Encounter (Signed)
Ok six mo worth may fill nmonthly

## 2015-07-03 ENCOUNTER — Encounter: Payer: Medicaid Other | Admitting: Family Medicine

## 2015-07-03 DIAGNOSIS — Z029 Encounter for administrative examinations, unspecified: Secondary | ICD-10-CM

## 2015-08-13 ENCOUNTER — Other Ambulatory Visit: Payer: Self-pay | Admitting: Endocrinology

## 2015-08-14 NOTE — Telephone Encounter (Signed)
Please advise if ok to refill. Last office visit was 03/22/2014.

## 2015-08-14 NOTE — Telephone Encounter (Signed)
Please refill x 1 Ov due 

## 2015-10-22 ENCOUNTER — Other Ambulatory Visit: Payer: Self-pay | Admitting: Endocrinology

## 2015-10-22 NOTE — Telephone Encounter (Signed)
Please advise if ok to refill. Last office visit was 03/23/2014.

## 2015-10-22 NOTE — Telephone Encounter (Signed)
Please refill x 1 Ov is due  

## 2015-10-22 NOTE — Telephone Encounter (Signed)
Rx submitted. Appointment letter mailed.

## 2015-11-26 ENCOUNTER — Telehealth: Payer: Self-pay | Admitting: Family Medicine

## 2015-11-26 NOTE — Telephone Encounter (Signed)
Pt is needing a refill on her methylphenidate 54 MG PO CR tablet.

## 2015-11-27 NOTE — Telephone Encounter (Signed)
30 d only

## 2015-11-28 ENCOUNTER — Other Ambulatory Visit: Payer: Self-pay | Admitting: *Deleted

## 2015-11-28 MED ORDER — METHYLPHENIDATE HCL ER (OSM) 54 MG PO TBCR: 54.0000 mg | EXTENDED_RELEASE_TABLET | Freq: Every day | ORAL | Status: DC

## 2015-11-28 NOTE — Telephone Encounter (Signed)
Script ready for pickup. Pt notified on voicemail.  

## 2015-12-26 ENCOUNTER — Other Ambulatory Visit: Payer: Self-pay | Admitting: Endocrinology

## 2016-01-05 ENCOUNTER — Other Ambulatory Visit: Payer: Self-pay | Admitting: Endocrinology

## 2016-01-18 ENCOUNTER — Other Ambulatory Visit: Payer: Self-pay | Admitting: Endocrinology

## 2016-01-21 ENCOUNTER — Telehealth: Payer: Self-pay | Admitting: Endocrinology

## 2016-01-21 MED ORDER — LEVOTHYROXINE SODIUM 75 MCG PO TABS: 75.0000 ug | ORAL_TABLET | Freq: Every day | ORAL | Status: DC

## 2016-01-21 NOTE — Telephone Encounter (Signed)
Pt needs Levothyroxine sent into [facility], she said there were several requests previously and that she went ahead and set up appt for 5/30

## 2016-01-21 NOTE — Telephone Encounter (Signed)
Please refill x 1 pending that appt

## 2016-01-21 NOTE — Telephone Encounter (Signed)
See note below and please advise. Last appointment was 03/23/2014. Thanks!

## 2016-01-21 NOTE — Telephone Encounter (Signed)
Rx submitted

## 2016-02-05 ENCOUNTER — Ambulatory Visit (INDEPENDENT_AMBULATORY_CARE_PROVIDER_SITE_OTHER): Payer: PRIVATE HEALTH INSURANCE | Admitting: Endocrinology

## 2016-02-05 ENCOUNTER — Encounter: Payer: Self-pay | Admitting: Endocrinology

## 2016-02-05 VITALS — BP 134/84 | HR 81 | Temp 98.2°F | Wt 207.0 lb

## 2016-02-05 DIAGNOSIS — E890 Postprocedural hypothyroidism: Secondary | ICD-10-CM | POA: Diagnosis not present

## 2016-02-05 MED ORDER — LEVOTHYROXINE SODIUM 75 MCG PO TABS: 75.0000 ug | ORAL_TABLET | Freq: Every day | ORAL | Status: DC

## 2016-02-05 NOTE — Progress Notes (Signed)
   Subjective:    Patient ID: Rachel Myers, female    DOB: 02/23/1976, 40 y.o.   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  Pt says she misses the synthroid approx twice a week.  pt states she feels well in general, except for fatigue.   Past Medical History  Diagnosis Date  . HYPOTHYROIDISM, POST-RADIATION 12/11/2009    Qualifier: Diagnosis of  By: Everardo AllEllison MD, Gregary SignsSean A   . UNSPECIFIED URTICARIA 02/21/2008    Qualifier: Diagnosis of  By: Everardo AllEllison MD, Cleophas DunkerSean A   . Depression   . Anxiety   . Insomnia     No past surgical history on file.  Social History   Social History  . Marital Status: Single    Spouse Name: N/A  . Number of Children: N/A  . Years of Education: N/A   Occupational History  . Customer Service    Social History Main Topics  . Smoking status: Former Games developermoker  . Smokeless tobacco: Not on file  . Alcohol Use: Not on file  . Drug Use: Not on file  . Sexual Activity: Not on file   Other Topics Concern  . Not on file   Social History Narrative   Single mother.   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          Current Outpatient Prescriptions on File Prior to Visit  Medication Sig Dispense Refill  . clonazePAM (KLONOPIN) 0.5 MG tablet TAKE ONE HALF TO ONE TABLET BY MOUTH TWICE DAILY AS NEEDED 30 tablet 5  . norethindrone-ethinyl estradiol (CYCLAFEM,ALYACEN) 0.5/0.75/1-35 MG-MCG tablet Take 1 tablet by mouth daily. 28 tablet 5   No current facility-administered medications on file prior to visit.    No Known Allergies  Family History  Problem Relation Age of Onset  . Thyroid disease Neg Hx     No thyroid dz in immediate family    BP 134/84 mmHg  Pulse 81  Temp(Src) 98.2 F (36.8 C) (Oral)  Wt 207 lb (93.895 kg)  SpO2 97%    Review of Systems She has gained weight.     Objective:   Physical  Exam VITAL SIGNS:  See vs page GENERAL: no distress NECK: There is no palpable thyroid enlargement.  No thyroid nodule is palpable.  No palpable lymphadenopathy at the anterior neck.   Lab Results  Component Value Date   TSH 6.16* 03/23/2014      Assessment & Plan:  Hypothyroidism: therapy limited by noncompliance.   Patient is advised the following: Patient Instructions  i have sent a prescription to your pharmacy, to refill the medication. Please recheck the blood test in 2-4 more weeks. You don't need to be fasting.   Please return in 1 year.     Romero BellingELLISON, Iman Orourke, MD

## 2016-02-05 NOTE — Patient Instructions (Addendum)
i have sent a prescription to your pharmacy, to refill the medication. Please recheck the blood test in 2-4 more weeks. You don't need to be fasting.   Please return in 1 year.

## 2016-02-11 ENCOUNTER — Ambulatory Visit: Payer: Medicaid Other | Admitting: Family Medicine

## 2016-03-04 ENCOUNTER — Other Ambulatory Visit: Payer: Self-pay | Admitting: Endocrinology

## 2016-03-04 ENCOUNTER — Other Ambulatory Visit (INDEPENDENT_AMBULATORY_CARE_PROVIDER_SITE_OTHER): Payer: PRIVATE HEALTH INSURANCE

## 2016-03-04 DIAGNOSIS — E890 Postprocedural hypothyroidism: Secondary | ICD-10-CM | POA: Diagnosis not present

## 2016-03-04 LAB — TSH: TSH: 4.86 u[IU]/mL — ABNORMAL HIGH (ref 0.35–4.50)

## 2016-03-04 MED ORDER — LEVOTHYROXINE SODIUM 88 MCG PO TABS: 88.0000 ug | ORAL_TABLET | Freq: Every day | ORAL | Status: DC

## 2016-03-05 ENCOUNTER — Telehealth: Payer: Self-pay | Admitting: Endocrinology

## 2016-03-05 NOTE — Telephone Encounter (Signed)
I contacted the pt and advised her recent thyroid labs were abnormal and Dr. Everardo AllEllison wanted to change the dosage to 88 mcg. Pt voiced understanding.

## 2016-03-05 NOTE — Telephone Encounter (Signed)
PT called, returning nurse phone call.  Requests call back.

## 2016-11-05 ENCOUNTER — Telehealth: Payer: Self-pay | Admitting: Endocrinology

## 2016-11-05 MED ORDER — LEVOTHYROXINE SODIUM 88 MCG PO TABS: 88.0000 ug | ORAL_TABLET | Freq: Every day | ORAL | 0 refills | Status: AC

## 2016-11-05 NOTE — Telephone Encounter (Signed)
Pt called in and said she [facility] [address] [place]

## 2016-11-05 NOTE — Telephone Encounter (Signed)
Refill submitted. 

## 2017-02-04 ENCOUNTER — Ambulatory Visit: Payer: PRIVATE HEALTH INSURANCE | Admitting: Endocrinology
